# Patient Record
Sex: Male | Born: 1990 | Race: Black or African American | Hispanic: No | Marital: Single | Smoking: Never smoker
Health system: Southern US, Community
[De-identification: ages and names within clinical notes are randomized; demographics above are authoritative.]

## PROBLEM LIST (undated history)

## (undated) DIAGNOSIS — F259 Schizoaffective disorder, unspecified: Secondary | ICD-10-CM

## (undated) DIAGNOSIS — F419 Anxiety disorder, unspecified: Secondary | ICD-10-CM

## (undated) DIAGNOSIS — F319 Bipolar disorder, unspecified: Secondary | ICD-10-CM

## (undated) DIAGNOSIS — J45909 Unspecified asthma, uncomplicated: Secondary | ICD-10-CM

## (undated) HISTORY — PX: FACIAL RECONSTRUCTION SURGERY: SHX631

---

## 2014-11-18 ENCOUNTER — Encounter (HOSPITAL_COMMUNITY): Payer: Self-pay | Admitting: Emergency Medicine

## 2014-11-18 ENCOUNTER — Emergency Department (HOSPITAL_COMMUNITY)
Admission: EM | Admit: 2014-11-18 | Discharge: 2014-11-18 | Disposition: A | Discharge status: Home / Self Care | Payer: Self-pay | Attending: Emergency Medicine | Admitting: Emergency Medicine

## 2014-11-18 ENCOUNTER — Emergency Department (HOSPITAL_COMMUNITY): Payer: Self-pay

## 2014-11-18 DIAGNOSIS — R079 Chest pain, unspecified: Secondary | ICD-10-CM | POA: Insufficient documentation

## 2014-11-18 DIAGNOSIS — J45901 Unspecified asthma with (acute) exacerbation: Secondary | ICD-10-CM | POA: Insufficient documentation

## 2014-11-18 HISTORY — DX: Unspecified asthma, uncomplicated: J45.909

## 2014-11-18 LAB — I-STAT TROPONIN, ED
Troponin i, poc: 0 ng/mL (ref 0.00–0.08)
Troponin i, poc: 0 ng/mL (ref 0.00–0.08)

## 2014-11-18 LAB — BASIC METABOLIC PANEL
ANION GAP: 7 (ref 5–15)
BUN: 17 mg/dL (ref 6–20)
CALCIUM: 9.5 mg/dL (ref 8.9–10.3)
CO2: 25 mmol/L (ref 22–32)
Chloride: 104 mmol/L (ref 101–111)
Creatinine, Ser: 1.11 mg/dL (ref 0.61–1.24)
GFR calc non Af Amer: >60 mL/min (ref >60)
GLUCOSE: 101 mg/dL — AB (ref 70–99)
Potassium: 3.6 mmol/L (ref 3.5–5.1)
SODIUM: 136 mmol/L (ref 135–145)

## 2014-11-18 LAB — CBC
HCT: 45.6 % (ref 39.0–52.0)
HEMOGLOBIN: 16.6 g/dL (ref 13.0–17.0)
MCH: 27 pg (ref 26.0–34.0)
MCHC: 36.4 g/dL — ABNORMAL HIGH (ref 30.0–36.0)
MCV: 74.1 fL — AB (ref 78.0–100.0)
Platelets: 184 10*3/uL (ref 150–400)
RBC: 6.15 MIL/uL — ABNORMAL HIGH (ref 4.22–5.81)
RDW: 13.7 % (ref 11.5–15.5)
WBC: 5.7 10*3/uL (ref 4.0–10.5)

## 2014-11-18 LAB — I-STAT CHEM 8, ED
BUN: 17 mg/dL (ref 6–20)
CHLORIDE: 101 mmol/L (ref 101–111)
Calcium, Ion: 1.17 mmol/L (ref 1.12–1.23)
Creatinine, Ser: 1.1 mg/dL (ref 0.61–1.24)
Glucose, Bld: 91 mg/dL (ref 70–99)
HEMATOCRIT: 47 % (ref 39.0–52.0)
Hemoglobin: 16 g/dL (ref 13.0–17.0)
Potassium: 3.7 mmol/L (ref 3.5–5.1)
Sodium: 141 mmol/L (ref 135–145)
TCO2: 23 mmol/L (ref 0–100)

## 2014-11-18 LAB — BRAIN NATRIURETIC PEPTIDE: B Natriuretic Peptide: 7.4 pg/mL (ref 0.0–100.0)

## 2014-11-18 MED ORDER — ASPIRIN 325 MG PO TABS
325.0000 mg | ORAL_TABLET | ORAL | Status: AC
  Administered 2014-11-18: 325 mg via ORAL
  Filled 2014-11-18: qty 1

## 2014-11-18 NOTE — Discharge Instructions (Signed)
Chest Pain (Nonspecific) °It is often hard to give a diagnosis for the cause of chest pain. There is always a chance that your pain could be related to something serious, such as a heart attack or a blood clot in the lungs. You need to follow up with your doctor. °HOME CARE °· If antibiotic medicine was given, take it as directed by your doctor. Finish the medicine even if you start to feel better. °· For the next few days, avoid activities that bring on chest pain. Continue physical activities as told by your doctor. °· Do not use any tobacco products. This includes cigarettes, chewing tobacco, and e-cigarettes. °· Avoid drinking alcohol. °· Only take medicine as told by your doctor. °· Follow your doctor's suggestions for more testing if your chest pain does not go away. °· Keep all doctor visits you made. °GET HELP IF: °· Your chest pain does not go away, even after treatment. °· You have a rash with blisters on your chest. °· You have a fever. °GET HELP RIGHT AWAY IF:  °· You have more pain or pain that spreads to your arm, neck, jaw, back, or belly (abdomen). °· You have shortness of breath. °· You cough more than usual or cough up blood. °· You have very bad back or belly pain. °· You feel sick to your stomach (nauseous) or throw up (vomit). °· You have very bad weakness. °· You pass out (faint). °· You have chills. °This is an emergency. Do not wait to see if the problems will go away. Call your local emergency services (911 in [place] Do not drive yourself to the hospital. °MAKE SURE YOU:  °· Understand these instructions. °· Will watch your condition. °· Will get help right away if you are not doing well or get worse. °Document Released: 12/16/2007 Document Revised: 07/04/2013 Document Reviewed: 12/16/2007 °ExitCare® Patient Information ©2015 [employer]. This information is not intended to replace advice given to you by your health care provider. Make sure you discuss any questions you have with your  health care provider. ° ° °Emergency Department Resource Guide °1) Find a Doctor and Pay Out of Pocket °Although you won't have to find out who is covered by your insurance plan, it is a good idea to ask around and get recommendations. You will then need to call the office and see if the doctor you have chosen will accept you as a new patient and what types of options they offer for patients who are self-pay. Some doctors offer discounts or will set up payment plans for their patients who do not have insurance, but you will need to ask so you aren't surprised when you get to your appointment. ° °2) Contact Your Local Health Department °Not all health departments have doctors that can see patients for sick visits, but many do, so it is worth a call to see if yours does. If you don't know where your local health department is, you can check in your phone book. The CDC also has a tool to help you locate your state's health department, and many state websites also have listings of all of their local health departments. ° °3) Find a Walk-in Clinic °If your illness is not likely to be very severe or complicated, you may want to try a walk in clinic. These are popping up all over the country in pharmacies, drugstores, and shopping centers. They're usually staffed by nurse practitioners or physician assistants that have been trained to treat common   illnesses and complaints. They're usually fairly quick and inexpensive. However, if you have serious medical issues or chronic medical problems, these are probably not your best option. ° °No Primary Care Doctor: °- Call [facility] at  832-8000 - they can help you locate a primary care doctor that  accepts your insurance, provides certain services, etc. °- Physician Referral Service- 1-800-533-3463 ° °Chronic Pain Problems: °Organization         Address  Phone   Notes  °[facility]  (336) 297-2271 Patients need to be referred by their primary care doctor.   ° °Medication Assistance: °Organization         Address  Phone   Notes  °[place] [facility] [address] °[place], [place] [address] (336) 641-8030 --Must be a resident of [place] °-- Must have NO insurance coverage whatsoever (no Medicaid/ Medicare, etc.) °-- The pt. MUST have a primary care doctor that directs their care regularly and follows them in the community °  °[facility]  (866) 331-1348   °[employer]  (888) 892-1162   ° °Agencies that provide inexpensive medical care: °Organization         Address  Phone   Notes  °[facility]  (336) 832-8035   °[facility]    (336) 832-7272   °[facility] [address] °[place], [place] [address] (336) 832-4777   °[facility] [place] [address], °[place] (336) 271-4999   °[facility]    (336) 373-0678   °[facility]    (336) 272-1050   °[facility] ° [address], [place] Phone:  (336) 832-4444, Fax:  (336) 832-4440 Hours of Operation:  9 am - 6 pm, M-F.  Also accepts Medicaid/Medicare and self-pay.  °[facility] ° [address], [place] Phone: (336) 832-3150, Fax: (336) 832-3151. Hours of Operation:  8:30 am - 5:30 pm, M-F.  Also accepts Medicaid and self-pay.  °[facility] [place] [address], [place] Phone: (336) 878-6027   °[facility] [address], [place], [place] (336)723-1848, Ext. 123 Mondays & Thursdays: 7-9 AM.  First 15 patients are seen on a first come, first serve basis. °  ° °Medicaid-accepting [place] Providers: ° °Organization         Address  Phone   Notes  °[facility] [address], [place] (336) 641-2100 Also accepts self-pay patients.  °[facility] [address], [place] ° (336) 856-9996   °[facility] [address] [place] [address], [place] (336) 288-8857   °[facility] [address] [place] [address], [place] (336) 299-7000   °Veita Bland [address], [place]  ° (336) 373-1557 Only accepts [place] Access Medicaid patients after they have their name applied to their card.  ° °Self-Pay (no insurance) in [place]: ° °Organization         Address  Phone   Notes  °Sickle Cell Patients, [facility] [address], [place] (336) 832-1970   °[facility] [address], [place] (336) 832-4400   °[facility] ° [address], [place] (336) 992-4800   °[facility]/Dr. Osei-Bonsu ° [address] [place] [address], [place] or [address],   [place] (336) 841-8500 Phone number for both [place] and [place] locations is the same.  °[facility] [address], [place] (336) 299-0000   °[facility] [address] [place] [address], [place] or [address] (336) 852-7530 °(336) 878-2260   °[facility] [address], [place] (336) 350-1642, phone; (336) 294-5005, fax Sees patients 1st and 3rd Saturday of every month.  Must not qualify for public or private insurance (i.e. Medicaid, Medicare, [place] Health Choice, Veterans' Benefits) • Household income should be no more than 200% of the poverty level •The clinic cannot treat you if you are pregnant or think you are pregnant • Sexually transmitted diseases are not treated at the clinic.  ° ° °Dental Care: °Organization         Address  Phone  Notes  °[facility] [address], [place] (336) 641-6152 Accepts children up to age 21 who are enrolled in Medicaid or [place] Health Choice; pregnant women with a Medicaid card; and children who have applied for Medicaid or [place] Health Choice, but were declined, whose parents can pay a reduced fee at time of service.    °[facility] [place]  [address], [place] (336) 641-7733 Accepts children up to age 21 who are enrolled in Medicaid or [place] Health Choice; pregnant women with a Medicaid card; and children who have applied for Medicaid or [place] Health Choice, but were declined, whose parents can pay a reduced fee at time of service.  °[facility] ° [address], [place] (336) 641-4533 Patients are seen by appointment only. Walk-ins are not accepted. [facility] will see patients 18 years of age and older. °Monday - Tuesday (8am-5pm) °Most Wednesdays (8:30-5pm) °$30 per visit, cash only  °[facility] ° [address], [place] (336) 641-4533 Patients are seen by appointment only. Walk-ins are not accepted. [facility] will see patients 18 years of age and older. °One Wednesday Evening (Monthly: Volunteer Based).  $30 per visit, cash only  °[facility]  (919) 537-3737 for adults; Children under age 4, call Graduate Pediatric Dentistry at (919) [phone]. Children aged 4-14, please call (919) [phone] to request a pediatric application. ° Dental services are provided in all areas of dental care including fillings, crowns and bridges, complete and partial dentures, implants, gum treatment, root canals, and extractions. Preventive care is also provided. Treatment is provided to both adults and children. °Patients are selected via a lottery and there is often a waiting list. °  °[facility] [address], °[place] ° (336) 763-8833 [email or web address] °  °[facility] [address], [place], [place] (336)723-1848, Ext. 123 Second and Fourth Thursday of each month, opens at 6:30 AM; Clinic ends at 9 AM.  Patients are seen on a first-come first-served basis, and a limited number are seen during each clinic.  ° °[facility] ° [address], [place], [place] (336)  723-7904   Eligibility Requirements °You must have lived in [place], [place], or [place] counties for at least the last three months. °  You cannot be eligible for state or federal sponsored healthcare insurance, including Veterans Administration, Medicaid, or Medicare. °  You generally cannot be eligible for healthcare insurance through your employer.  °  How to apply: °Eligibility screenings are held every Tuesday   and Wednesday afternoon from 1:00 pm until 4:00 pm. You do not need an appointment for the interview!  °[facility] [address], [place], [place] 336-631-2330   °[facility]  336-342-8273   °[facility]  336-703-3100   °[facility]  336-570-6415   ° °Behavioral Health Resources in the Community: °Intensive Outpatient Programs °Organization         Address  Phone  Notes  °[facility] [address], [place], [place] 336-878-6098   °[facility] [address], [place], [place] 336-832-9800   °[facility] [address], [place], [place] ° 336-882-2125   °[facility] [address],  °[place], [place] 1-800-853-5163 or 336-641-4981   °Substance Abuse Resources °Organization         Address  Phone  Notes  °[facility]  336-882-2125   °[facility]  336-784-9470   °[facility]  336-285-9073   °[facility]  336-845-3988   °Residential & Outpatient Substance Abuse Program  1-800-659-3381   °Psychological Services °Organization         Address  Phone  Notes  °[facility]  336- 832-9600   °[facility]  336- 378-7881   °[facility] [address], [place] 1-800-853-5163 or 336-641-4981   ° °Mobile Crisis Teams °Organization         Address  Phone  Notes  °[facility], Mobile Crisis Care Unit  1-877-626-1772   °Assertive °[facility] ° [address] [place], [place] 336-834-9664   °Sharon DeEsch [address] °[place] 336-554-5454   ° °Self-Help/Support Groups °Organization         Address  Phone             Notes  °[facility] - variety of support groups  336- 373-1402 Call for more information  °Narcotics Anonymous (NA), [facility] [address], °[place]  2 meetings at this location  ° °Residential Treatment Programs °Organization         Address  Phone  Notes  °[facility] [address],    °[place]  1-866-801-8205   °[facility] ° [address], [place], [place] 704-293-8524   °[facility] [address], [place] 336-845-3988 Admissions: 8am-3pm M-F  °[facility] [address]    °[place], [place] 336-841-1104   °[facility] [address], [place], [place] 336-379-7146   °[facility] [address]  °[place], [place] 336-285-9073   °[facility] Intensive Outpatient [address], [place], [place] 336-852-3033   °[facility] [address]  °[place], [place] 1-877-615-2722 or 336-784-9470   °[facility] [address] [place], [place] 336-227-7417 Accepts Medicaid  °[facility] [address]  ° [place] 1-800-659-3381 Substance Abuse/Addiction Treatment  ° °[place] Behavioral Health Resources °Organization         Address  Phone  Notes  °[facility]  (888) 581-9988   °Julie Brannon, PhD [address] [place], [place]   (336) 349-5553 or (336) 951-0000   °[facility]   [address] °[place], [place] (336) 349-4454   °[facility] [address], [place], [place] (336) 342-8316 Insurance/Medicaid/sponsorship through [facility]  °[facility] [address]                                    [place], [place] (336) 342-8316  Therapy/tele-psych/case  °[facility] [address]  ° [place], [place] (336) 349-2233    °Dr. Arfeen  (336) 349-4544   °[facility] [place]  [employer] [place] [facility] 1) [address], [place] °2) [address], [place] °3)  [address], [place] (336) 349-3220 °(336) 342-7768 ° °(336) 342-8140   °[place] [facility] (336) 342-1394 or (336) 342-3537 (After Hours)    ° ° ° °

## 2014-11-18 NOTE — ED Notes (Signed)
Pt states that he has been having lt sided throbbing pain in chest.  States that he is shob.  Pt not wanting to talk but will when pressed to.  Family states that a family member is a CMA and felt his pulse and felt periods of tachycardia, bradycardia, and pauses.  NSR on monitor.
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
## 2015-10-11 IMAGING — CR DG CHEST 1V PORT
1 series · 1 of 1 positions shown · non-contrast
Comparison: 06/18/2014

CLINICAL DATA: Throbbing left-sided chest pain today. Dyspnea.
History of asthma.

EXAM:
PORTABLE CHEST - 1 VIEW

[AP]
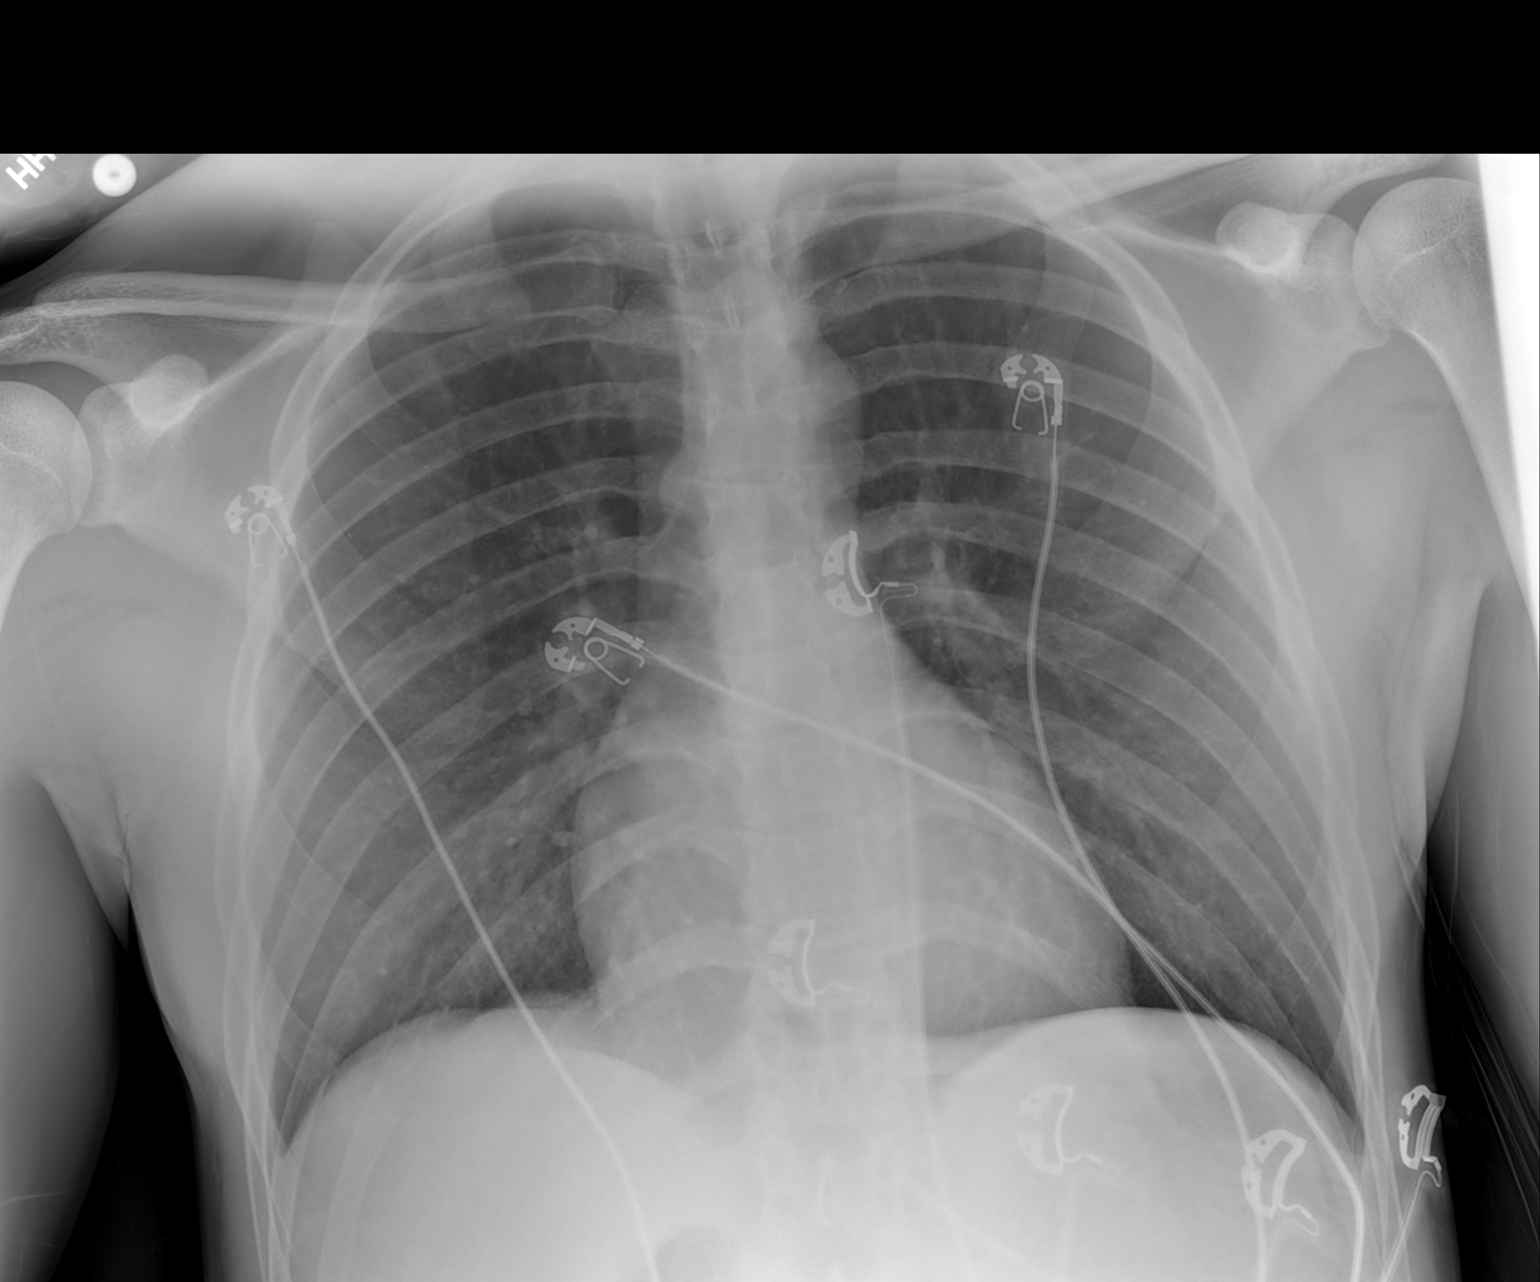

[1 of 1 positions shown; findings below may reference images not displayed]

FINDINGS: A single AP portable view of the chest demonstrates no focal
airspace consolidation or alveolar edema. The lungs are grossly
clear. There is no large effusion or pneumothorax. Cardiac and
mediastinal contours appear unremarkable.
IMPRESSION: No active disease.

## 2016-10-10 ENCOUNTER — Encounter (HOSPITAL_BASED_OUTPATIENT_CLINIC_OR_DEPARTMENT_OTHER): Payer: Self-pay | Admitting: Emergency Medicine

## 2016-10-10 ENCOUNTER — Emergency Department (HOSPITAL_BASED_OUTPATIENT_CLINIC_OR_DEPARTMENT_OTHER)
Admission: EM | Admit: 2016-10-10 | Discharge: 2016-10-10 | Discharge status: Against Medical Advice | Payer: Self-pay | Attending: Dermatology | Admitting: Dermatology

## 2016-10-10 DIAGNOSIS — F419 Anxiety disorder, unspecified: Secondary | ICD-10-CM | POA: Insufficient documentation

## 2016-10-10 DIAGNOSIS — Z5321 Procedure and treatment not carried out due to patient leaving prior to being seen by health care provider: Secondary | ICD-10-CM | POA: Insufficient documentation

## 2016-10-10 HISTORY — DX: Schizoaffective disorder, unspecified: F25.9

## 2016-10-10 HISTORY — DX: Anxiety disorder, unspecified: F41.9

## 2016-10-10 HISTORY — DX: Bipolar disorder, unspecified: F31.9

## 2016-10-10 NOTE — ED Triage Notes (Signed)
Woke up SOB today and went to work and felt like it got worse. Brought in by EMS, denies SOB or anxiety at present. Drowsy in triage. C/O of being"tired"

## 2021-12-13 ENCOUNTER — Emergency Department (HOSPITAL_BASED_OUTPATIENT_CLINIC_OR_DEPARTMENT_OTHER)
Admission: EM | Admit: 2021-12-13 | Discharge: 2021-12-13 | Disposition: A | Discharge status: Home / Self Care | Payer: Self-pay | Attending: Emergency Medicine | Admitting: Emergency Medicine

## 2021-12-13 ENCOUNTER — Other Ambulatory Visit: Payer: Self-pay

## 2021-12-13 ENCOUNTER — Encounter (HOSPITAL_BASED_OUTPATIENT_CLINIC_OR_DEPARTMENT_OTHER): Payer: Self-pay | Admitting: Emergency Medicine

## 2021-12-13 DIAGNOSIS — G43009 Migraine without aura, not intractable, without status migrainosus: Secondary | ICD-10-CM | POA: Insufficient documentation

## 2021-12-13 MED ORDER — DIPHENHYDRAMINE HCL 50 MG/ML IJ SOLN
12.5000 mg | Freq: Once | INTRAMUSCULAR | Status: AC
  Administered 2021-12-13: 12.5 mg via INTRAVENOUS
  Filled 2021-12-13: qty 1

## 2021-12-13 MED ORDER — SODIUM CHLORIDE 0.9 % IV BOLUS
1000.0000 mL | Freq: Once | INTRAVENOUS | Status: AC
  Administered 2021-12-13: 1000 mL via INTRAVENOUS

## 2021-12-13 MED ORDER — METOCLOPRAMIDE HCL 5 MG/ML IJ SOLN
10.0000 mg | Freq: Once | INTRAMUSCULAR | Status: AC
  Administered 2021-12-13: 10 mg via INTRAVENOUS
  Filled 2021-12-13: qty 2

## 2021-12-13 MED ORDER — KETOROLAC TROMETHAMINE 15 MG/ML IJ SOLN
15.0000 mg | Freq: Once | INTRAMUSCULAR | Status: AC
  Administered 2021-12-13: 15 mg via INTRAVENOUS
  Filled 2021-12-13: qty 1

## 2021-12-13 NOTE — ED Triage Notes (Signed)
Patient states that he has had three migraines within the last month. States that  he is nauseous.States that he does not have any medication for his migrainges

## 2021-12-13 NOTE — ED Provider Notes (Addendum)
[facility] EMERGENCY DEPARTMENT Provider Note   CSN: [id number] Arrival date & time: 12/13/21  7829     History  Chief Complaint  Patient presents with   Migraine    Jason Wang is a 31 y.o. male with a past medical history of migraines presenting today with a migraine.  Says he woke up this morning and had a migraine, nausea and vomiting.  Denies photophobia but says "I just cannot keep my eyes open."  No phonophobia.  He does have a history of migraines, followed with neurology for a year, was prescribed sumatriptan but eventually they told him he no longer needed to take it.  He has had 3 migraines this month.  Does not have any more medications.   Migraine Associated symptoms include headaches.      Home Medications Prior to Admission medications   Medication Sig Start Date End Date Taking? Authorizing Provider  QUEtiapine (SEROQUEL) 100 MG tablet Take 100 mg by mouth at bedtime.    [provider]      Allergies    Fish allergy    Review of Systems   Review of Systems  Gastrointestinal:  Positive for nausea and vomiting.  Neurological:  Positive for dizziness and headaches. Negative for speech difficulty, weakness and numbness.   Physical Exam Updated Vital Signs BP 117/79   Pulse 68   Temp 97.7 F (36.5 C) (Oral)   Resp 18   SpO2 100%  Physical Exam Vitals and nursing note reviewed.  Constitutional:      Appearance: Normal appearance. He is ill-appearing.  HENT:     Head: Normocephalic and atraumatic.  Eyes:     General: No scleral icterus.    Extraocular Movements: Extraocular movements intact.     Conjunctiva/sclera: Conjunctivae normal.     Pupils: Pupils are equal, round, and reactive to light.  Cardiovascular:     Rate and Rhythm: Normal rate and regular rhythm.  Pulmonary:     Effort: Pulmonary effort is normal. No respiratory distress.     Breath sounds: No wheezing.  Skin:    General: Skin is warm and dry.      Findings: No rash.  Neurological:     Mental Status: He is alert and oriented to person, place, and time.     Cranial Nerves: No cranial nerve deficit.     Comments: Difficulty participating in finger-nose due to pain with eyes open  Psychiatric:        Mood and Affect: Mood normal.    ED Results / Procedures / Treatments   Labs (all labs ordered are listed, but only abnormal results are displayed) Labs Reviewed - No data to display  EKG None  Radiology No results found.  Procedures Procedures   Medications Ordered in ED Medications  ketorolac (TORADOL) 15 MG/ML injection 15 mg (has no administration in time range)  metoCLOPramide (REGLAN) injection 10 mg (has no administration in time range)  diphenhydrAMINE (BENADRYL) injection 12.5 mg (has no administration in time range)  sodium chloride 0.9 % bolus 1,000 mL (1,000 mLs Intravenous New Bag/Given 12/13/21 1001)    ED Course/ Medical Decision Making/ A&P Clinical Course as of 12/13/21 1132  Sat Dec 13, 2021  1028 Reports feeling somewhat better [MR]    Clinical Course User Index [MR] Neill Jurewicz, Gabriel Cirri, PA-C                           Medical Decision  Making Risk Prescription drug management.   This patient presents to the ED for concern of migraine. Emergent considerations for headache include subarachnoid hemorrhage, meningitis, temporal arteritis, glaucoma, cerebral ischemia, carotid/vertebral dissection, intracranial tumor, Venous sinus thrombosis, carbon monoxide poisoning, acute or chronic subdural hemorrhage.     This is not an exhaustive differential.    Past Medical History / Co-morbidities / Social History: Chronic migraines   Physical Exam: Physical exam performed. The pertinent findings include: None  Lab Tests: None ordered   Imaging Studies: Imaging considered however nonfocal or concerning neurologic exam.  Doubt acute intracranial etiology of headache.    Medications: I ordered medication  including Toradol, Reglan, benadryl and IVF. Reevaluation of the patient after these medicines showed that the patient resolved. I have reviewed the patients home medicines and have made adjustments as needed.   Disposition: After consideration of the diagnostic results and the patients response to treatment, I feel that patient is stable for discharge home.  He is agreeable and requesting discharge at this time.  He will continue over-the-counter migraine care and follow-up with neurology.   Final Clinical Impression(s) / ED Diagnoses Final diagnoses:  Migraine without aura and without status migrainosus, not intractable    Rx / DC Orders   Results and diagnoses were explained to the patient. Return precautions discussed in full. Patient had no additional questions and expressed complete understanding.   This chart was dictated using voice recognition software.  Despite best efforts to proofread,  errors can occur which can change the documentation meaning.    Jason Benders, PA-C 12/13/21 1135   Patient ambulated out of the department but appeared somewhat off balance.  RN came and notified me.  I went to the bedside to reassess the patient and he was still neurologically intact.  No problem with finger-nose, PERRLA, moving all extremities.  Says that he feels somewhat drowsy.  Likely secondary to IV Benadryl.  His mother is on the way to pick him up.  He is still stable for discharge with return precautions   Jason Benders, PA-C 12/13/21 1202    Rolan Bucco, MD 12/13/21 1429

## 2021-12-13 NOTE — ED Notes (Signed)
Ambulatory to [facility] to wait for ride. Drinking po fluids and eating crackers

## 2021-12-13 NOTE — Discharge Instructions (Signed)
Please follow-up with your neurologist about the frequency of your migraines.  If you no longer have 1, there is an office attached to these papers.  You may need prescription medication as you used in the past.

## 2022-09-13 ENCOUNTER — Emergency Department (HOSPITAL_BASED_OUTPATIENT_CLINIC_OR_DEPARTMENT_OTHER)
Admission: EM | Admit: 2022-09-13 | Discharge: 2022-09-13 | Disposition: A | Discharge status: Home / Self Care | Payer: BC Managed Care – PPO | Attending: Emergency Medicine | Admitting: Emergency Medicine

## 2022-09-13 ENCOUNTER — Encounter (HOSPITAL_BASED_OUTPATIENT_CLINIC_OR_DEPARTMENT_OTHER): Payer: Self-pay

## 2022-09-13 ENCOUNTER — Other Ambulatory Visit: Payer: Self-pay

## 2022-09-13 ENCOUNTER — Emergency Department (HOSPITAL_BASED_OUTPATIENT_CLINIC_OR_DEPARTMENT_OTHER): Payer: BC Managed Care – PPO

## 2022-09-13 DIAGNOSIS — R1011 Right upper quadrant pain: Secondary | ICD-10-CM | POA: Diagnosis not present

## 2022-09-13 DIAGNOSIS — R1013 Epigastric pain: Secondary | ICD-10-CM | POA: Insufficient documentation

## 2022-09-13 LAB — CBC WITH DIFFERENTIAL/PLATELET
Abs Immature Granulocytes: 0.01 10*3/uL (ref 0.00–0.07)
Basophils Absolute: 0 10*3/uL (ref 0.0–0.1)
Basophils Relative: 1 %
Eosinophils Absolute: 0.1 10*3/uL (ref 0.0–0.5)
Eosinophils Relative: 3 %
HCT: 40.3 % (ref 39.0–52.0)
Hemoglobin: 14.3 g/dL (ref 13.0–17.0)
Immature Granulocytes: 0 %
Lymphocytes Relative: 60 %
Lymphs Abs: 3 10*3/uL (ref 0.7–4.0)
MCH: 26.3 pg (ref 26.0–34.0)
MCHC: 35.5 g/dL (ref 30.0–36.0)
MCV: 74.2 fL — ABNORMAL LOW (ref 80.0–100.0)
Monocytes Absolute: 0.4 10*3/uL (ref 0.1–1.0)
Monocytes Relative: 7 %
Neutro Abs: 1.5 10*3/uL — ABNORMAL LOW (ref 1.7–7.7)
Neutrophils Relative %: 29 %
Platelets: 205 10*3/uL (ref 150–400)
RBC: 5.43 MIL/uL (ref 4.22–5.81)
RDW: 13.8 % (ref 11.5–15.5)
WBC: 5 10*3/uL (ref 4.0–10.5)
nRBC: 0 % (ref 0.0–0.2)

## 2022-09-13 LAB — COMPREHENSIVE METABOLIC PANEL
ALT: 14 U/L (ref 0–44)
AST: 17 U/L (ref 15–41)
Albumin: 4.1 g/dL (ref 3.5–5.0)
Alkaline Phosphatase: 42 U/L (ref 38–126)
Anion gap: 6 (ref 5–15)
BUN: 13 mg/dL (ref 6–20)
CO2: 24 mmol/L (ref 22–32)
Calcium: 8.4 mg/dL — ABNORMAL LOW (ref 8.9–10.3)
Chloride: 105 mmol/L (ref 98–111)
Creatinine, Ser: 1.12 mg/dL (ref 0.61–1.24)
GFR, Estimated: >60 mL/min (ref >60)
Glucose, Bld: 96 mg/dL (ref 70–99)
Potassium: 3.5 mmol/L (ref 3.5–5.1)
Sodium: 135 mmol/L (ref 135–145)
Total Bilirubin: 1.3 mg/dL — ABNORMAL HIGH (ref 0.3–1.2)
Total Protein: 7.3 g/dL (ref 6.5–8.1)

## 2022-09-13 LAB — LIPASE, BLOOD: Lipase: 37 U/L (ref 11–51)

## 2022-09-13 MED ORDER — SODIUM CHLORIDE 0.9 % IV BOLUS
1000.0000 mL | Freq: Once | INTRAVENOUS | Status: AC
  Administered 2022-09-13: 1000 mL via INTRAVENOUS

## 2022-09-13 MED ORDER — SUCRALFATE 1 G PO TABS: 1.0000 g | ORAL_TABLET | Freq: Three times a day (TID) | ORAL | 0 refills | Status: DC

## 2022-09-13 MED ORDER — PANTOPRAZOLE SODIUM 40 MG PO TBEC
40.0000 mg | DELAYED_RELEASE_TABLET | Freq: Every day | ORAL | Status: DC
  Administered 2022-09-13: 40 mg via ORAL
  Filled 2022-09-13: qty 1

## 2022-09-13 MED ORDER — PANTOPRAZOLE SODIUM 20 MG PO TBEC: 20.0000 mg | DELAYED_RELEASE_TABLET | Freq: Every day | ORAL | 0 refills | Status: DC

## 2022-09-13 NOTE — ED Provider Notes (Signed)
[place] EMERGENCY DEPARTMENT AT [facility] Provider Note   CSN: [id number] Arrival date & time: 09/13/22  2148     History  Chief Complaint  Patient presents with   Abdominal Pain    Jason Wang is a 32 y.o. male.  Patient here with abdominal pain for the last few days.  On the right upper quadrant, epigastric, right flank.  No history of kidney stones.  Denies any nausea vomiting or diarrhea.  No prior abdominal surgery.  He is not sure what makes it worse or better.  Denies any chest pain or shortness of breath.  No fever or chills.  States has history of ulcers.  Denies any black or bloody stools.  The history is provided by the patient.       Home Medications Prior to Admission medications   Medication Sig Start Date End Date Taking? Authorizing Provider  pantoprazole (PROTONIX) 20 MG tablet Take 1 tablet (20 mg total) by mouth daily for 14 days. 09/13/22 09/27/22 Yes Maely Clements, DO  sucralfate (CARAFATE) 1 g tablet Take 1 tablet (1 g total) by mouth 4 (four) times daily -  with meals and at bedtime for 14 days. 09/13/22 09/27/22 Yes Nelma Phagan, DO  QUEtiapine (SEROQUEL) 100 MG tablet Take 100 mg by mouth at bedtime.    [provider]      Allergies    Fish allergy    Review of Systems   Review of Systems  Physical Exam Updated Vital Signs BP (!) 126/94   Pulse 65   Temp 97.7 F (36.5 C) (Oral)   Resp 15   Ht '5\' 6"'$  (1.676 m)   Wt 68.9 kg   SpO2 99%   BMI 24.53 kg/m  Physical Exam Vitals and nursing note reviewed.  Constitutional:      General: He is not in acute distress.    Appearance: He is well-developed. He is not ill-appearing.  HENT:     Head: Normocephalic and atraumatic.     Mouth/Throat:     Mouth: Mucous membranes are moist.  Eyes:     Conjunctiva/sclera: Conjunctivae normal.  Cardiovascular:     Rate and Rhythm: Normal rate and regular rhythm.     Heart sounds: Normal heart sounds. No murmur heard. Pulmonary:      Effort: Pulmonary effort is normal. No respiratory distress.     Breath sounds: Normal breath sounds.  Abdominal:     Palpations: Abdomen is soft.     Tenderness: There is abdominal tenderness in the right upper quadrant and epigastric area. There is right CVA tenderness.  Musculoskeletal:        General: No swelling.     Cervical back: Neck supple.  Skin:    General: Skin is warm and dry.     Capillary Refill: Capillary refill takes less than 2 seconds.  Neurological:     Mental Status: He is alert.  Psychiatric:        Mood and Affect: Mood normal.     ED Results / Procedures / Treatments   Labs (all labs ordered are listed, but only abnormal results are displayed) Labs Reviewed  CBC WITH DIFFERENTIAL/PLATELET - Abnormal; Notable for the following components:      Result Value   MCV 74.2 (*)    Neutro Abs 1.5 (*)    All other components within normal limits  COMPREHENSIVE METABOLIC PANEL - Abnormal; Notable for the following components:   Calcium 8.4 (*)  Total Bilirubin 1.3 (*)    All other components within normal limits  LIPASE, BLOOD    EKG None  Radiology CT Renal Stone Study  Result Date: 09/13/2022 CLINICAL DATA:  Right-sided flank pain for several days, initial encounter EXAM: CT ABDOMEN AND PELVIS WITHOUT CONTRAST TECHNIQUE: Multidetector CT imaging of the abdomen and pelvis was performed following the standard protocol without IV contrast. RADIATION DOSE REDUCTION: This exam was performed according to the departmental dose-optimization program which includes automated exposure control, adjustment of the mA and/or kV according to patient size and/or use of iterative reconstruction technique. COMPARISON:  None Available. FINDINGS: Lower chest: No acute abnormality. Hepatobiliary: No focal liver abnormality is seen. No gallstones, gallbladder wall thickening, or biliary dilatation. Pancreas: Unremarkable. No pancreatic ductal dilatation or surrounding  inflammatory changes. Spleen: Normal in size without focal abnormality. Adrenals/Urinary Tract: Adrenal glands are within normal limits. Kidneys are well visualized bilaterally. No renal calculi or obstructive changes are noted. The ureters are within normal limits. Bladder is partially distended. Stomach/Bowel: No obstructive or inflammatory changes of the colon are seen. Mild retained fecal material is seen. The appendix is well visualized and within normal limits. Small bowel and stomach are unremarkable. Vascular/Lymphatic: No significant vascular findings are present. No enlarged abdominal or pelvic lymph nodes. Reproductive: Prostate is unremarkable. Other: No abdominal wall hernia or abnormality. No abdominopelvic ascites. Musculoskeletal: No acute or significant osseous findings. IMPRESSION: No acute abnormality noted. Electronically Signed   By: Inez Catalina M.D.   On: 09/13/2022 22:16    Procedures Procedures    Medications Ordered in ED Medications  sodium chloride 0.9 % bolus 1,000 mL (1,000 mLs Intravenous New Bag/Given 09/13/22 2209)    ED Course/ Medical Decision Making/ A&P                             Medical Decision Making Amount and/or Complexity of Data Reviewed Labs: ordered. Radiology: ordered.   Jen Rivka Barbara is here with abdominal pain.  Normal vitals.  No fever.  History of anxiety, bipolar, schizoaffective disorder.  Differential diagnosis is gastritis versus possibly cholecystitis, gallstones, kidney stone, less likely colitis/appendicitis, bowel obstruction.  Will get CBC, CMP, lipase, CT scan abdomen pelvis.  Per my review and interpretation labs no significant anemia or electrolyte abnormality or kidney injury or leukocytosis.  CT scan per radiology reports unremarkable.  Will put him on Protonix and Carafate for suspected reflux/gastritis.  Will have him follow-up with primary care doctor.  Discharged in good condition.  This chart was dictated using voice  recognition software.  Despite best efforts to proofread,  errors can occur which can change the documentation meaning.         Final Clinical Impression(s) / ED Diagnoses Final diagnoses:  Epigastric pain    Rx / DC Orders ED Discharge Orders          Ordered    pantoprazole (PROTONIX) 20 MG tablet  Daily        09/13/22 2230    sucralfate (CARAFATE) 1 g tablet  3 times daily with meals & bedtime        09/13/22 2230              Lennice Sites, DO 09/13/22 2230

## 2022-09-13 NOTE — ED Triage Notes (Signed)
Pt reports right upper/lower abdominal pain that shift higher and lower intermittently. Pain has been constant over last 4 days. Pt reports nausea, denies vomiting. Denies weakness/fatigue/fevers. No aggravating/alleviating factors noted besides being less painful in the morning.

## 2022-09-16 ENCOUNTER — Encounter: Payer: Self-pay | Admitting: *Deleted

## 2022-09-16 ENCOUNTER — Ambulatory Visit
Admission: EM | Admit: 2022-09-16 | Discharge: 2022-09-16 | Disposition: A | Discharge status: Home / Self Care | Payer: BC Managed Care – PPO

## 2022-09-16 DIAGNOSIS — K5900 Constipation, unspecified: Secondary | ICD-10-CM

## 2022-09-16 NOTE — Discharge Instructions (Signed)
While there are no acute findings on the CT scanning of your abdomen that was done at the emergency room, I do believe that you have a moderate to high stool burden at this time which is the most likely cause of your current discomfort.  I recommend that you go to [facility] across the street and purchase a bottle of magnesium citrate saline laxative and any flavor that you like.  Please drink the entire bottle and anticipate a large bowel movement in the next hour or so.  For the next 24 hours, recommend that you follow a clear liquid diet meaning you can eat or drink anything that you can see through such as broth, Gatorade, ginger ale, fruit flavored Jell-O's.  After 24 hours, if your belly is feeling better, you can reintroduce bland foods avoiding things that are spicy, greasy, acidic or contain dairy.  Please be sure that you drink between 80 and 120 ounces of water per day on a regular basis and be sure that you are eating enough fresh fruits and vegetables and high-fiber grains.
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
## 2022-09-16 NOTE — ED Triage Notes (Signed)
C/O starting with right abd pain 6 days ago; states had appt with PCP & had labs drawn. 3 days ago the pain was getting worse, so pt went to ED and had CT scan - states was told everything was fine and to start med to help with pain. Today as he was leaving work to get Rx, pt felt a "bubbling" sensation followed by burning in RLQ. Pain much worse with movement. States has not been able to have BM despite sensation that he needs to. Last BM was this AM "very small".

## 2022-09-19 ENCOUNTER — Emergency Department (HOSPITAL_BASED_OUTPATIENT_CLINIC_OR_DEPARTMENT_OTHER)
Admission: EM | Admit: 2022-09-19 | Discharge: 2022-09-19 | Disposition: A | Discharge status: Home / Self Care | Payer: BC Managed Care – PPO | Attending: Emergency Medicine | Admitting: Emergency Medicine

## 2022-09-19 ENCOUNTER — Other Ambulatory Visit: Payer: Self-pay

## 2022-09-19 ENCOUNTER — Encounter (HOSPITAL_BASED_OUTPATIENT_CLINIC_OR_DEPARTMENT_OTHER): Payer: Self-pay | Admitting: Emergency Medicine

## 2022-09-19 DIAGNOSIS — R109 Unspecified abdominal pain: Secondary | ICD-10-CM | POA: Diagnosis present

## 2022-09-19 DIAGNOSIS — R1030 Lower abdominal pain, unspecified: Secondary | ICD-10-CM

## 2022-09-19 DIAGNOSIS — R1013 Epigastric pain: Secondary | ICD-10-CM | POA: Insufficient documentation

## 2022-09-19 DIAGNOSIS — R1031 Right lower quadrant pain: Secondary | ICD-10-CM | POA: Diagnosis not present

## 2022-09-19 LAB — CBC
HCT: 42.5 % (ref 39.0–52.0)
Hemoglobin: 14.9 g/dL (ref 13.0–17.0)
MCH: 26.3 pg (ref 26.0–34.0)
MCHC: 35.1 g/dL (ref 30.0–36.0)
MCV: 75 fL — ABNORMAL LOW (ref 80.0–100.0)
Platelets: 194 10*3/uL (ref 150–400)
RBC: 5.67 MIL/uL (ref 4.22–5.81)
RDW: 13.9 % (ref 11.5–15.5)
WBC: 4.2 10*3/uL (ref 4.0–10.5)
nRBC: 0 % (ref 0.0–0.2)

## 2022-09-19 LAB — COMPREHENSIVE METABOLIC PANEL
ALT: 15 U/L (ref 0–44)
AST: 19 U/L (ref 15–41)
Albumin: 4.3 g/dL (ref 3.5–5.0)
Alkaline Phosphatase: 46 U/L (ref 38–126)
Anion gap: 5 (ref 5–15)
BUN: 6 mg/dL (ref 6–20)
CO2: 27 mmol/L (ref 22–32)
Calcium: 8.6 mg/dL — ABNORMAL LOW (ref 8.9–10.3)
Chloride: 103 mmol/L (ref 98–111)
Creatinine, Ser: 1.2 mg/dL (ref 0.61–1.24)
GFR, Estimated: >60 mL/min (ref >60)
Glucose, Bld: 114 mg/dL — ABNORMAL HIGH (ref 70–99)
Potassium: 3.5 mmol/L (ref 3.5–5.1)
Sodium: 135 mmol/L (ref 135–145)
Total Bilirubin: 1.3 mg/dL — ABNORMAL HIGH (ref 0.3–1.2)
Total Protein: 8 g/dL (ref 6.5–8.1)

## 2022-09-19 LAB — URINALYSIS, ROUTINE W REFLEX MICROSCOPIC
Bilirubin Urine: NEGATIVE
Glucose, UA: NEGATIVE mg/dL
Hgb urine dipstick: NEGATIVE
Ketones, ur: NEGATIVE mg/dL
Leukocytes,Ua: NEGATIVE
Nitrite: NEGATIVE
Protein, ur: NEGATIVE mg/dL
Specific Gravity, Urine: 1.01 (ref 1.005–1.030)
pH: 6 (ref 5.0–8.0)

## 2022-09-19 LAB — LIPASE, BLOOD: Lipase: 35 U/L (ref 11–51)

## 2022-09-19 MED ORDER — KETOROLAC TROMETHAMINE 15 MG/ML IJ SOLN
15.0000 mg | Freq: Once | INTRAMUSCULAR | Status: AC
Start: 2022-09-19 — End: 2022-09-19
  Administered 2022-09-19: 15 mg via INTRAVENOUS
  Filled 2022-09-19: qty 1

## 2022-09-19 MED ORDER — DICYCLOMINE HCL 20 MG PO TABS: 20.0000 mg | ORAL_TABLET | Freq: Two times a day (BID) | ORAL | 0 refills | Status: DC

## 2022-09-19 NOTE — ED Notes (Signed)
Discharge paperwork reviewed entirely with patient, including Rx's and follow up care. Pain was under control. Pt verbalized understanding as well as all parties involved. No questions or concerns voiced at the time of discharge. No acute distress noted.   Pt ambulated out to PVA without incident or assistance.  

## 2022-09-19 NOTE — Discharge Instructions (Addendum)
You were evaluated today for abdominal pain.  Your blood work is all very reassuring.  You were given a prescription for some medication called Bentyl to see if that can help with your pain.  Please follow-up with the GI doctor.  If your pain worsens or he develop fever, vomiting or any other worrisome changes you need to come back to the ER right away as you may need repeat CT scan at that time

## 2022-09-19 NOTE — ED Triage Notes (Signed)
Pt here for constant abd pain x1 week. Pt saw his PCP and was prescribed pantoprazole. Pt was then seen for the same 3 days ago at [facility], and was prescribed meds and laxatives. Pt states pain is RLQ cramping and radiates to his back. Pt reports last BM was Thursday. Pt has been following clear liquid diet

## 2022-09-19 NOTE — ED Provider Notes (Signed)
[facility] Provider Note   CSN: [id number] Arrival date & time: 09/19/22  1749     History  Chief Complaint  Patient presents with   Abdominal Pain    Jason Wang is a 32 y.o. male.  He presents emergency department for abdominal pain.  He was seen on 3/9 for pain at that time and had labs and a CT scan all of which were negative.  He states the pain got significantly worse 3 days later and he went to urgent care.  They gave him laxatives which she took with some relief but states his pain still has not completely gone away.  He states the days ago his pain was at its worst but his pain has not gone above a 5 out of 10 since then.  Denies fevers or chills or vomiting.  Denies any exacerbating or relieving factors   Abdominal Pain      Home Medications Prior to Admission medications   Medication Sig Start Date End Date Taking? Authorizing Provider  dicyclomine (BENTYL) 20 MG tablet Take 1 tablet (20 mg total) by mouth 2 (two) times daily. 09/19/22  Yes Oluwatimilehin Balfour A, PA-C  pantoprazole (PROTONIX) 20 MG tablet Take 1 tablet (20 mg total) by mouth daily for 14 days. 09/13/22 09/27/22  Curatolo, Adam, DO  SUMAtriptan Succinate (IMITREX PO) Take by mouth. prn    [provider]      Allergies    Fish allergy and Bee venom    Review of Systems   Review of Systems  Gastrointestinal:  Positive for abdominal pain.    Physical Exam Updated Vital Signs BP 120/79 (BP Location: Right Arm)   Pulse 70   Temp 97.7 F (36.5 C) (Oral)   Resp 16   SpO2 98%  Physical Exam Vitals and nursing note reviewed.  Constitutional:      General: He is not in acute distress.    Appearance: He is well-developed.  HENT:     Head: Normocephalic and atraumatic.  Eyes:     Conjunctiva/sclera: Conjunctivae normal.  Cardiovascular:     Rate and Rhythm: Normal rate and regular rhythm.     Heart sounds: No murmur heard. Pulmonary:      Effort: Pulmonary effort is normal. No respiratory distress.     Breath sounds: Normal breath sounds.  Abdominal:     Palpations: Abdomen is soft.     Tenderness: There is abdominal tenderness in the right lower quadrant, epigastric area and suprapubic area. There is no right CVA tenderness, left CVA tenderness, guarding or rebound. Negative signs include Murphy's sign, Rovsing's sign and McBurney's sign.  Musculoskeletal:        General: No swelling.     Cervical back: Neck supple.  Skin:    General: Skin is warm and dry.     Capillary Refill: Capillary refill takes less than 2 seconds.  Neurological:     General: No focal deficit present.     Mental Status: He is alert.  Psychiatric:        Mood and Affect: Mood normal.     ED Results / Procedures / Treatments   Labs (all labs ordered are listed, but only abnormal results are displayed) Labs Reviewed  COMPREHENSIVE METABOLIC PANEL - Abnormal; Notable for the following components:      Result Value   Glucose, Bld 114 (*)    Calcium 8.6 (*)    Total Bilirubin 1.3 (*)  All other components within normal limits  CBC - Abnormal; Notable for the following components:   MCV 75.0 (*)    All other components within normal limits  LIPASE, BLOOD  URINALYSIS, ROUTINE W REFLEX MICROSCOPIC    EKG None  Radiology No results found.  Procedures Procedures    Medications Ordered in ED Medications  ketorolac (TORADOL) 15 MG/ML injection 15 mg (15 mg Intravenous Given 09/19/22 1843)    ED Course/ Medical Decision Making/ A&P                             Medical Decision Making This patient presents to the ED for concern of right-sided abdominal pain, this involves an extensive number of treatment options, and is a complaint that carries with it a high risk of complications and morbidity.  The differential diagnosis includes gastritis, cholecystitis, cholelithiasis, UTI, colitis, inflammatory bowel disease, appendicitis,  other     Additional history obtained:  Additional history obtained from EMR External records from outside source obtained and reviewed including CT scan, recent ED visit and care visit   Lab Tests:  I Ordered, and personally interpreted labs.  The pertinent results include: CMP shows a bilirubin of 1.3 which is stable from his recent visit, normal renal function, lipase is normal, UA is negative, CBC shows no leukocytosis, no anemia      Problem List / ED Course / Critical interventions / Medication management  Abdominal pain-patient is having right mid and lower abdominal pain and suprapubic tenderness ongoing for about a week, had CT 6 days ago that was normal, pain was at its worst 3 days ago when he went to urgent care and he was given laxatives for possible constipation as accounts and he had large amount of bowel movements but has stopped the pain but states that it has been better since Wednesday.  He not having nausea or vomiting, discussed recent benefits of repeat CT, use shared decision making.  Patient does not feel he needs repeat CT at this time, discussed that he has abnormal labs and vital signs and his abdomen is tender but he has no rebound guarding or rigidity.  He had appendicitis or intra-abdominal abscess or other severe intra-abdominal process was suspected some change in vitals and/or labs.  Discussed he will need to follow-up with GI but if his symptoms worsen he needs to come back to the ER right away I ordered medication including Toradol for pain Reevaluation of the patient after these medicines showed that the patient improved I have reviewed the patients home medicines and have made adjustments as needed    Test / Admission - Considered:   Repeat CT scan.  Patient's labs are completely stable, vitals are reassuring.  He is not tachycardic or febrile denies fevers at home.  His pain is improved after the Toradol.  Discussed risk and benefits of repeat CT  scan at this time.  Patient is agreeable with holding off on repeat imaging as his pain has been slightly better the past 3 days    Amount and/or Complexity of Data Reviewed Labs: ordered.  Risk Prescription drug management.           Final Clinical Impression(s) / ED Diagnoses Final diagnoses:  Lower abdominal pain    Rx / DC Orders ED Discharge Orders          Ordered    dicyclomine (BENTYL) 20 MG tablet  2 times daily  09/19/22 1948              Gwenevere Abbot, PA-C 09/19/22 1956    Cristie Hem, MD 09/20/22 1505

## 2022-10-01 ENCOUNTER — Other Ambulatory Visit: Payer: Self-pay

## 2022-10-01 ENCOUNTER — Emergency Department (HOSPITAL_BASED_OUTPATIENT_CLINIC_OR_DEPARTMENT_OTHER)
Admission: EM | Admit: 2022-10-01 | Discharge: 2022-10-02 | Disposition: A | Discharge status: Home / Self Care | Payer: BC Managed Care – PPO | Attending: Emergency Medicine | Admitting: Emergency Medicine

## 2022-10-01 ENCOUNTER — Emergency Department (HOSPITAL_BASED_OUTPATIENT_CLINIC_OR_DEPARTMENT_OTHER): Payer: BC Managed Care – PPO

## 2022-10-01 DIAGNOSIS — R1084 Generalized abdominal pain: Secondary | ICD-10-CM | POA: Diagnosis not present

## 2022-10-01 DIAGNOSIS — J45909 Unspecified asthma, uncomplicated: Secondary | ICD-10-CM | POA: Insufficient documentation

## 2022-10-01 DIAGNOSIS — R109 Unspecified abdominal pain: Secondary | ICD-10-CM | POA: Diagnosis present

## 2022-10-01 LAB — CBC WITH DIFFERENTIAL/PLATELET
Abs Immature Granulocytes: 0.01 10*3/uL (ref 0.00–0.07)
Basophils Absolute: 0 10*3/uL (ref 0.0–0.1)
Basophils Relative: 1 %
Eosinophils Absolute: 0.1 10*3/uL (ref 0.0–0.5)
Eosinophils Relative: 1 %
HCT: 42.5 % (ref 39.0–52.0)
Hemoglobin: 15.3 g/dL (ref 13.0–17.0)
Immature Granulocytes: 0 %
Lymphocytes Relative: 61 %
Lymphs Abs: 3.4 10*3/uL (ref 0.7–4.0)
MCH: 26.4 pg (ref 26.0–34.0)
MCHC: 36 g/dL (ref 30.0–36.0)
MCV: 73.3 fL — ABNORMAL LOW (ref 80.0–100.0)
Monocytes Absolute: 0.4 10*3/uL (ref 0.1–1.0)
Monocytes Relative: 8 %
Neutro Abs: 1.6 10*3/uL — ABNORMAL LOW (ref 1.7–7.7)
Neutrophils Relative %: 29 %
Platelets: 187 10*3/uL (ref 150–400)
RBC: 5.8 MIL/uL (ref 4.22–5.81)
RDW: 13.7 % (ref 11.5–15.5)
WBC: 5.5 10*3/uL (ref 4.0–10.5)
nRBC: 0 % (ref 0.0–0.2)

## 2022-10-01 LAB — COMPREHENSIVE METABOLIC PANEL
ALT: 14 U/L (ref 0–44)
AST: 25 U/L (ref 15–41)
Albumin: 4.4 g/dL (ref 3.5–5.0)
Alkaline Phosphatase: 51 U/L (ref 38–126)
Anion gap: 13 (ref 5–15)
BUN: 9 mg/dL (ref 6–20)
CO2: 21 mmol/L — ABNORMAL LOW (ref 22–32)
Calcium: 9.1 mg/dL (ref 8.9–10.3)
Chloride: 100 mmol/L (ref 98–111)
Creatinine, Ser: 1.25 mg/dL — ABNORMAL HIGH (ref 0.61–1.24)
GFR, Estimated: >60 mL/min (ref >60)
Glucose, Bld: 103 mg/dL — ABNORMAL HIGH (ref 70–99)
Potassium: 3 mmol/L — ABNORMAL LOW (ref 3.5–5.1)
Sodium: 134 mmol/L — ABNORMAL LOW (ref 135–145)
Total Bilirubin: 1.7 mg/dL — ABNORMAL HIGH (ref 0.3–1.2)
Total Protein: 8 g/dL (ref 6.5–8.1)

## 2022-10-01 LAB — URINALYSIS, ROUTINE W REFLEX MICROSCOPIC
Bilirubin Urine: NEGATIVE
Glucose, UA: NEGATIVE mg/dL
Hgb urine dipstick: NEGATIVE
Ketones, ur: NEGATIVE mg/dL
Leukocytes,Ua: NEGATIVE
Nitrite: NEGATIVE
Protein, ur: NEGATIVE mg/dL
Specific Gravity, Urine: 1.01 (ref 1.005–1.030)
pH: 7 (ref 5.0–8.0)

## 2022-10-01 LAB — LIPASE, BLOOD: Lipase: 36 U/L (ref 11–51)

## 2022-10-01 MED ORDER — DICYCLOMINE HCL 20 MG PO TABS: 20.0000 mg | ORAL_TABLET | Freq: Two times a day (BID) | ORAL | 0 refills | Status: AC

## 2022-10-01 MED ORDER — MORPHINE SULFATE (PF) 4 MG/ML IV SOLN
4.0000 mg | Freq: Once | INTRAVENOUS | Status: AC
  Administered 2022-10-01: 4 mg via INTRAVENOUS
  Filled 2022-10-01: qty 1

## 2022-10-01 MED ORDER — SODIUM CHLORIDE 0.9 % IV BOLUS
1000.0000 mL | Freq: Once | INTRAVENOUS | Status: AC
  Administered 2022-10-01: 1000 mL via INTRAVENOUS

## 2022-10-01 MED ORDER — IOHEXOL 300 MG/ML  SOLN
100.0000 mL | Freq: Once | INTRAMUSCULAR | Status: AC | PRN
Start: 2022-10-01 — End: 2022-10-01
  Administered 2022-10-01: 100 mL via INTRAVENOUS

## 2022-10-01 MED ORDER — POTASSIUM CHLORIDE CRYS ER 20 MEQ PO TBCR
40.0000 meq | EXTENDED_RELEASE_TABLET | Freq: Once | ORAL | Status: AC
  Administered 2022-10-01: 40 meq via ORAL
  Filled 2022-10-01: qty 2

## 2022-10-01 MED ORDER — PANTOPRAZOLE SODIUM 20 MG PO TBEC
20.0000 mg | DELAYED_RELEASE_TABLET | Freq: Every day | ORAL | 0 refills | Status: AC
Start: 2022-10-01 — End: 2022-10-15

## 2022-10-01 NOTE — Discharge Instructions (Addendum)
You have been evaluated for your symptoms.  No concerning findings were noted on today's exam.  Your CT scan looks normal.  Your potassium level is low.  Please eat a banana a day for the next week and have your potassium level rechecked.  Follow-up with GI specialist for outpatient evaluation of your condition.

## 2022-10-01 NOTE — ED Provider Notes (Signed)
[facility] Provider Note   CSN: [id number] Arrival date & time: 10/01/22  2136     History  Chief Complaint  Patient presents with   Flank Pain   Abdominal Pain    Jason Wang is a 32 y.o. male.  The history is provided by the patient and medical records. No language interpreter was used.  Flank Pain Associated symptoms include abdominal pain.  Abdominal Pain    32 year old male significant history of schizoaffective disorder, asthma, anxiety, presenting complaining of abdominal pain.  Patient report for the past several weeks he has had recurrent pain to the right side of his abdomen.  I described pain as a burning uncomfortable sensation that comes and goes.  Sometimes it is going across his lower abdomen sometimes to the side of his abdomen and sometimes towards the back.  The pain is intense sometimes he endorsed shortness of breath.  Nothing seems to make the pain better or worse.  Pain is not always associated with eating.  No fever or chills no productive cough or shortness of breath no dysuria or hematuria.  States he was seen for this complaint several times and has had CT scan that did not show any acute finding.  He was given medication to help with his pain including acid reducer and antispasmodic which does seem to help but his medication ran out today.  He now endorses worsening pain.  No prior history of PE or DVT.  Home Medications Prior to Admission medications   Medication Sig Start Date End Date Taking? Authorizing Provider  dicyclomine (BENTYL) 20 MG tablet Take 1 tablet (20 mg total) by mouth 2 (two) times daily. 09/19/22   Sherrye Payor A, PA-C  pantoprazole (PROTONIX) 20 MG tablet Take 1 tablet (20 mg total) by mouth daily for 14 days. 09/13/22 09/27/22  Curatolo, Adam, DO  SUMAtriptan Succinate (IMITREX PO) Take by mouth. prn    [provider]      Allergies    Fish allergy and Bee venom    Review of  Systems   Review of Systems  Gastrointestinal:  Positive for abdominal pain.  Genitourinary:  Positive for flank pain.  All other systems reviewed and are negative.   Physical Exam Updated Vital Signs BP (!) 119/99 (BP Location: Right Arm)   Pulse 87   Temp 97.6 F (36.4 C) (Oral)   Resp (!) 24   Wt 68.9 kg   SpO2 100%   BMI 24.53 kg/m  Physical Exam Vitals and nursing note reviewed.  Constitutional:      General: He is not in acute distress.    Appearance: He is well-developed.  HENT:     Head: Atraumatic.  Eyes:     Conjunctiva/sclera: Conjunctivae normal.  Cardiovascular:     Rate and Rhythm: Normal rate and regular rhythm.     Heart sounds: Normal heart sounds.  Pulmonary:     Effort: Pulmonary effort is normal.     Breath sounds: Normal breath sounds.  Abdominal:     Palpations: Abdomen is soft.     Tenderness: There is abdominal tenderness in the right upper quadrant, right lower quadrant and epigastric area. There is no right CVA tenderness, left CVA tenderness, guarding or rebound. Negative signs include Murphy's sign.  Musculoskeletal:     Cervical back: Neck supple.  Skin:    Findings: No rash.  Neurological:     Mental Status: He is alert.  ED Results / Procedures / Treatments   Labs (all labs ordered are listed, but only abnormal results are displayed) Labs Reviewed  CBC WITH DIFFERENTIAL/PLATELET - Abnormal; Notable for the following components:      Result Value   MCV 73.3 (*)    Neutro Abs 1.6 (*)    All other components within normal limits  COMPREHENSIVE METABOLIC PANEL - Abnormal; Notable for the following components:   Sodium 134 (*)    Potassium 3.0 (*)    CO2 21 (*)    Glucose, Bld 103 (*)    Creatinine, Ser 1.25 (*)    Total Bilirubin 1.7 (*)    All other components within normal limits  LIPASE, BLOOD  URINALYSIS, ROUTINE W REFLEX MICROSCOPIC    EKG None  Radiology CT ABDOMEN PELVIS W CONTRAST  Result Date:  10/01/2022 CLINICAL DATA:  Abdominal pain, acute, nonlocalized. EXAM: CT ABDOMEN AND PELVIS WITH CONTRAST TECHNIQUE: Multidetector CT imaging of the abdomen and pelvis was performed using the standard protocol following bolus administration of intravenous contrast. RADIATION DOSE REDUCTION: This exam was performed according to the departmental dose-optimization program which includes automated exposure control, adjustment of the mA and/or kV according to patient size and/or use of iterative reconstruction technique. CONTRAST:  167mL OMNIPAQUE IOHEXOL 300 MG/ML  SOLN COMPARISON:  CT examination dated September 13, 2018 FINDINGS: Lower chest: No acute abnormality. Hepatobiliary: No focal liver abnormality is seen. No gallstones, gallbladder wall thickening, or biliary dilatation. Pancreas: Unremarkable. No pancreatic ductal dilatation or surrounding inflammatory changes. Spleen: Normal in size without focal abnormality. Adrenals/Urinary Tract: Adrenal glands are unremarkable. Kidneys are normal, without renal calculi, focal lesion, or hydronephrosis. Bladder is unremarkable. Stomach/Bowel: Stomach is within normal limits. Appendix appears normal (series 5, image 49). No evidence of bowel wall thickening, distention, or inflammatory changes. Vascular/Lymphatic: No significant vascular findings are present. No enlarged abdominal or pelvic lymph nodes. Reproductive: Prostate is unremarkable. Other: No abdominal wall hernia or abnormality. No abdominopelvic ascites. Musculoskeletal: No acute or significant osseous findings. IMPRESSION: No CT evidence of acute abdominal/pelvic process. Electronically Signed   By: Keane Police D.O.   On: 10/01/2022 23:24    Procedures Procedures    Medications Ordered in ED Medications  morphine (PF) 4 MG/ML injection 4 mg (4 mg Intravenous Given 10/01/22 2230)  potassium chloride SA (KLOR-CON M) CR tablet 40 mEq (40 mEq Oral Given 10/01/22 2328)  sodium chloride 0.9 % bolus 1,000 mL  (1,000 mLs Intravenous New Bag/Given 10/01/22 2333)  iohexol (OMNIPAQUE) 300 MG/ML solution 100 mL (100 mLs Intravenous Contrast Given 10/01/22 2301)    ED Course/ Medical Decision Making/ A&P                             Medical Decision Making Amount and/or Complexity of Data Reviewed Labs: ordered. Radiology: ordered.  Risk Prescription drug management.   BP (!) 119/99 (BP Location: Right Arm)   Pulse 87   Temp 97.6 F (36.4 C) (Oral)   Resp (!) 24   Wt 68.9 kg   SpO2 100%   BMI 24.53 kg/m   73:75 PM 32 year old male significant history of schizoaffective disorder, asthma, anxiety, presenting complaining of abdominal pain.  Patient report for the past several weeks he has had recurrent pain to the right side of his abdomen.  I described pain as a burning uncomfortable sensation that comes and goes.  Sometimes it is going across his lower abdomen sometimes to the  side of his abdomen and sometimes towards the back.  The pain is intense sometimes he endorsed shortness of breath.  Nothing seems to make the pain better or worse.  Pain is not always associated with eating.  No fever or chills no productive cough or shortness of breath no dysuria or hematuria.  States he was seen for this complaint several times and has had CT scan that did not show any acute finding.  He was given medication to help with his pain including acid reducer and antispasmodic which does seem to help but his medication ran out today.  He now endorses worsening pain.  No prior history of PE or DVT.  On exam, patient is laying in bed appears slightly anxious.  He is mildly tachypneic.  Heart with normal rate and rhythm, lungs are clear to auscultation bilaterally abdomen is soft but he does have tenderness to both right upper and right lower abdomen without guarding or rebound tenderness.  No CVA tenderness.  No overlying skin changes.  EMR review patient has been seen twice for his complaint within the past 3  weeks.  Previously had a CT scan of the abdomen pelvis without any acute finding.  Labs overall reassuring.  However given worsening pain prompting this ER visit, will repeat CT scan.  Will provide supportive care.  PE considered however patient is PERC negative, low suspicion for PE.  -Labs ordered, independently viewed and interpreted by me.  Labs remarkable for Cr 1.25.  IVF given.  K+ 3.0, supplementation given.  Normal WBC. UA without blood or UTI.  -The patient was maintained on a cardiac monitor.  I personally viewed and interpreted the cardiac monitored which showed an underlying rhythm of: NSR -Imaging independently viewed and interpreted by me and I agree with radiologist's interpretation.  Result remarkable for abd/pelvis CT without acute changes.   -This patient presents to the ED for concern of abd pain, this involves an extensive number of treatment options, and is a complaint that carries with it a high risk of complications and morbidity.  The differential diagnosis includes gastritis, GERD, cholecystitis, appendicitis, colitis, pancreatitis, UTI, kidney stone, pna -Co morbidities that complicate the patient evaluation includes bipolar, anxiety -Treatment includes morphine, IVF.  -Reevaluation of the patient after these medicines showed that the patient improved -PCP office notes or outside notes reviewed -Escalation to admission/observation considered: patients feels much better, is comfortable with discharge, and will follow up with GI specialist on April 4th -Prescription medication considered, patient comfortable with bentyl and protonix -Social Determinant of Health considered  11:46 PM Potassium is 3.0, supplementation given.  Otherwise labs are reassuring.  CT scan of the abdomen pelvis without any acute finding.  I suspect patient's symptoms likely gastritis/GERD given the burning sensation.  Will refill his medication and gave outpatient follow-up with GI as previously  scheduled.         Final Clinical Impression(s) / ED Diagnoses Final diagnoses:  Generalized abdominal pain    Rx / DC Orders ED Discharge Orders          Ordered    dicyclomine (BENTYL) 20 MG tablet  2 times daily        10/01/22 2349    pantoprazole (PROTONIX) 20 MG tablet  Daily        10/01/22 2349              Domenic Moras, PA-C 10/01/22 2351    Tegeler, Gwenyth Allegra, MD 10/02/22 0002

## 2022-10-01 NOTE — ED Triage Notes (Signed)
Pt arrives with c/o right sided flank pain that radiates into ABD. Pt seen for the same a couple of weeks ago.

## 2024-03-21 ENCOUNTER — Encounter (HOSPITAL_BASED_OUTPATIENT_CLINIC_OR_DEPARTMENT_OTHER): Payer: Self-pay

## 2024-03-21 ENCOUNTER — Other Ambulatory Visit: Payer: Self-pay

## 2024-03-21 ENCOUNTER — Emergency Department (HOSPITAL_BASED_OUTPATIENT_CLINIC_OR_DEPARTMENT_OTHER)
Admission: EM | Admit: 2024-03-21 | Discharge: 2024-03-21 | Disposition: A | Discharge status: Home / Self Care | Payer: Self-pay | Attending: Emergency Medicine | Admitting: Emergency Medicine

## 2024-03-21 ENCOUNTER — Emergency Department (HOSPITAL_BASED_OUTPATIENT_CLINIC_OR_DEPARTMENT_OTHER): Payer: Self-pay

## 2024-03-21 DIAGNOSIS — J4520 Mild intermittent asthma, uncomplicated: Secondary | ICD-10-CM | POA: Insufficient documentation

## 2024-03-21 LAB — CBC WITH DIFFERENTIAL/PLATELET
Abs Immature Granulocytes: 0.01 K/uL (ref 0.00–0.07)
Basophils Absolute: 0 K/uL (ref 0.0–0.1)
Basophils Relative: 1 %
Eosinophils Absolute: 0.1 K/uL (ref 0.0–0.5)
Eosinophils Relative: 2 %
HCT: 43.9 % (ref 39.0–52.0)
Hemoglobin: 15.6 g/dL (ref 13.0–17.0)
Immature Granulocytes: 0 %
Lymphocytes Relative: 56 %
Lymphs Abs: 1.9 K/uL (ref 0.7–4.0)
MCH: 26.8 pg (ref 26.0–34.0)
MCHC: 35.5 g/dL (ref 30.0–36.0)
MCV: 75.3 fL — ABNORMAL LOW (ref 80.0–100.0)
Monocytes Absolute: 0.3 K/uL (ref 0.1–1.0)
Monocytes Relative: 8 %
Neutro Abs: 1.1 K/uL — ABNORMAL LOW (ref 1.7–7.7)
Neutrophils Relative %: 33 %
Platelets: 164 K/uL (ref 150–400)
RBC: 5.83 MIL/uL — ABNORMAL HIGH (ref 4.22–5.81)
RDW: 13.5 % (ref 11.5–15.5)
Smear Review: NORMAL
WBC: 3.4 K/uL — ABNORMAL LOW (ref 4.0–10.5)
nRBC: 0 % (ref 0.0–0.2)

## 2024-03-21 LAB — COMPREHENSIVE METABOLIC PANEL WITH GFR
ALT: 17 U/L (ref 0–44)
AST: 25 U/L (ref 15–41)
Albumin: 4.4 g/dL (ref 3.5–5.0)
Alkaline Phosphatase: 50 U/L (ref 38–126)
Anion gap: 10 (ref 5–15)
BUN: 9 mg/dL (ref 6–20)
CO2: 25 mmol/L (ref 22–32)
Calcium: 9.3 mg/dL (ref 8.9–10.3)
Chloride: 104 mmol/L (ref 98–111)
Creatinine, Ser: 1.26 mg/dL — ABNORMAL HIGH (ref 0.61–1.24)
GFR, Estimated: >60 mL/min (ref >60)
Glucose, Bld: 93 mg/dL (ref 70–99)
Potassium: 4.2 mmol/L (ref 3.5–5.1)
Sodium: 139 mmol/L (ref 135–145)
Total Bilirubin: 1.1 mg/dL (ref 0.0–1.2)
Total Protein: 7.2 g/dL (ref 6.5–8.1)

## 2024-03-21 LAB — RESP PANEL BY RT-PCR (RSV, FLU A&B, COVID)  RVPGX2
Influenza A by PCR: NEGATIVE
Influenza B by PCR: NEGATIVE
Resp Syncytial Virus by PCR: NEGATIVE
SARS Coronavirus 2 by RT PCR: NEGATIVE

## 2024-03-21 LAB — TROPONIN T, HIGH SENSITIVITY: Troponin T High Sensitivity: <15 ng/L (ref 0–19)

## 2024-03-21 MED ORDER — IPRATROPIUM-ALBUTEROL 0.5-2.5 (3) MG/3ML IN SOLN
3.0000 mL | Freq: Once | RESPIRATORY_TRACT | Status: AC
  Administered 2024-03-21: 3 mL via RESPIRATORY_TRACT
  Filled 2024-03-21: qty 3

## 2024-03-21 MED ORDER — PREDNISONE 20 MG PO TABS
40.0000 mg | ORAL_TABLET | Freq: Every day | ORAL | 0 refills | Status: AC
Start: 2024-03-21 — End: ?

## 2024-03-21 NOTE — Progress Notes (Signed)
 Patient ambulated around unit x2. Beginning SPO2 was 98% HR 57 RR 18. Patient reported no SHOB but a little tightness in chest. He had received an albuterol  neb treatment before ambulation. The lowest SPO2 during ambulation was 94% HR 80 RR 21. Patient returned to room and placed back on the monitor.

## 2024-03-21 NOTE — Discharge Instructions (Addendum)
 I'm glad you are feeling better.  Please follow-up with your primary care provider.  Seek emergency care if experiencing any new or worsening symptoms.

## 2024-03-21 NOTE — ED Triage Notes (Signed)
 Last week had sinus pressure, headache and fatigue. Felt better over the weekend. Yesterday went back to work and started feeling unwell again. Started becoming short of breath and some chest pressure yesterday. Hx of asthma. Used nebulizer without much improvement.

## 2024-03-21 NOTE — ED Provider Notes (Signed)
 [place] [facility] Provider Note   CSN: [id number] Arrival date & time: 03/21/24  9077     Patient presents with: Shortness of Breath   Jason Wang is a 32 y.o. male with PMHx anxiety, asthma, bipolar 1 disorder, schizoaffective disorder who presents to ED concerned for chest tightness. Patient with sinus pressure, headache, and fatigue last week which has since improved. Patient then started noticing SOB and chest tightness yesterday. The SOB resolved with their home nebulizing treatment yesterday. Patient becoming SOB again today and only had 1/2 of a nebulizing treatment available.  Patient still with slight middle chest tightness after nebulizing treatment at home so he came to the ED.  Denies recent surgery/immobilization, hx DVT/PE, hemoptysis, hx cancer in the past 6 months, calf swelling/tenderness. Denies tobacco use. Denies fever, nausea, vomiting, diarrhea.     Shortness of Breath      Prior to Admission medications   Medication Sig Start Date End Date Taking? Authorizing Provider  predniSONE  (DELTASONE ) 20 MG tablet Take 2 tablets (40 mg total) by mouth daily. 03/21/24  Yes Hoy Fraction F, PA-C  dicyclomine  (BENTYL ) 20 MG tablet Take 1 tablet (20 mg total) by mouth 2 (two) times daily. 10/01/22   Nivia Colon, PA-C  pantoprazole  (PROTONIX ) 20 MG tablet Take 1 tablet (20 mg total) by mouth daily for 14 days. 10/01/22 10/15/22  Nivia Colon, PA-C  SUMAtriptan Succinate (IMITREX PO) Take by mouth. prn    [provider]    Allergies: Fish allergy and Bee venom    Review of Systems  Respiratory:  Positive for shortness of breath.     Updated Vital Signs BP (!) 132/97 (BP Location: Right Arm)   Pulse (!) 55   Temp 97.7 F (36.5 C) (Oral)   Resp 18   Ht 5' 6 (1.676 m)   Wt 68.5 kg   SpO2 100%   BMI 24.37 kg/m   Physical Exam Vitals and nursing note reviewed.  Constitutional:      General: He is not in acute  distress.    Appearance: He is not ill-appearing or toxic-appearing.  HENT:     Head: Normocephalic and atraumatic.     Mouth/Throat:     Mouth: Mucous membranes are moist.     Pharynx: No posterior oropharyngeal erythema.  Eyes:     General: No scleral icterus.       Right eye: No discharge.        Left eye: No discharge.     Conjunctiva/sclera: Conjunctivae normal.  Cardiovascular:     Rate and Rhythm: Normal rate and regular rhythm.     Pulses: Normal pulses.     Heart sounds: No murmur heard. Pulmonary:     Effort: Pulmonary effort is normal. No respiratory distress.     Breath sounds: Normal breath sounds. No wheezing, rhonchi or rales.  Abdominal:     Tenderness: There is no abdominal tenderness.  Musculoskeletal:     Right lower leg: No edema.     Left lower leg: No edema.     Comments: No erythema, swelling or tenderness of BL calves/LE.  Skin:    General: Skin is warm and dry.     Findings: No rash.  Neurological:     General: No focal deficit present.     Mental Status: He is alert and oriented to person, place, and time. Mental status is at baseline.  Psychiatric:        Mood  and Affect: Mood normal.        Behavior: Behavior normal.     (all labs ordered are listed, but only abnormal results are displayed) Labs Reviewed  CBC WITH DIFFERENTIAL/PLATELET - Abnormal; Notable for the following components:      Result Value   WBC 3.4 (*)    RBC 5.83 (*)    MCV 75.3 (*)    Neutro Abs 1.1 (*)    All other components within normal limits  COMPREHENSIVE METABOLIC PANEL WITH GFR - Abnormal; Notable for the following components:   Creatinine, Ser 1.26 (*)    All other components within normal limits  RESP PANEL BY RT-PCR (RSV, FLU A&B, COVID)  [id number]  TROPONIN T, HIGH SENSITIVITY    EKG: EKG Interpretation Date/Time:  Tuesday March 21 2024 09:32:49 EDT Ventricular Rate:  68 PR Interval:  157 QRS Duration:  97 QT Interval:  387 QTC Calculation: 412 R  Axis:   89  Text Interpretation: Sinus arrhythmia Consider left atrial enlargement ST elev, probable normal early repol pattern agree, no sig chnage from previous Confirmed by [name] [phone]) on 03/21/2024 10:36:33 AM  Radiology: [facility] Chest 2 View Result Date: 03/21/2024 EXAM: 2 VIEW(S) XRAY OF THE CHEST 03/21/2024 09:49:43 AM COMPARISON: 11/18/2014. CLINICAL HISTORY: Chest pressure, short of breath, recently sick. Last week had sinus pressure, headache and fatigue. Felt better over the weekend. Yesterday went back to work and started feeling unwell again. Started becoming short of breath and some chest pressure yesterday. History of asthma. Used nebulizer without much improvement. FINDINGS: LUNGS AND PLEURA: No focal pulmonary opacity. No pulmonary edema. No pleural effusion. No pneumothorax. HEART AND MEDIASTINUM: No acute abnormality of the cardiac and mediastinal silhouettes. BONES AND SOFT TISSUES: No acute osseous abnormality. IMPRESSION: 1. No acute process. Electronically signed by: [name] MD 03/21/2024 10:58 AM EDT RP Workstation: [id number]     Procedures   Medications Ordered in the ED  ipratropium-albuterol  (DUONEB) 0.5-2.5 (3) MG/3ML nebulizer solution 3 mL (3 mLs Nebulization Given 03/21/24 1021)                                    Medical Decision Making Amount and/or Complexity of Data Reviewed Labs: ordered. Radiology: ordered.  Risk Prescription drug management.   This patient presents to the ED for concern of shortness of breath, this involves an extensive number of treatment options, and is a complaint that carries with it a high risk of complications and morbidity.  The differential diagnosis includes Anxiety, Anaphylaxis/Angioedema, Aspirated FB, Arrhythmia, CHF, Asthma, COPD, PNA, COVID/Flu/RSV, STEMI, Tamponade, TPNX, Sepsis   Co morbidities that complicate the patient evaluation  anxiety, asthma, bipolar 1 disorder, schizoaffective  disorder   Additional history obtained:  Dr. Neysa PCP   Problem List / ED Course / Critical interventions / Medication management  Patient presents to ED concerned for SOB/chest tightness I did not fully go away after half of a nebulizing treatment at home earlier today.  Physical exam reassuring.  Patient afebrile with stable vitals.  PERC/Wells score 0. I Ordered, and personally interpreted labs.  CBC without leukocytosis or anemia.  CMP with mild elevation in creatinine at 1.26 which is near patient's baseline.  Respiratory panel negative.  Troponin within normal limits. The patient was maintained on a cardiac monitor.  I personally viewed and interpreted the cardiac monitored which showed an underlying rhythm of: Sinus arrhythmia I ordered imaging  studies including chest xray to assess for process contributing to patient's symptoms. I independently visualized and interpreted imaging which showed no acute process. I agree with the radiologist interpretation Provided patient with DuoNeb treatment.  Patient's symptoms have since resolved.  Patient now feeling good.  Shared with patient that we could consider furthering testing with D-dimer or possible CT scan of chest.  Patient declining this and stating that he feels better.  Patient stating that his asthma always acts up more during the change of seasons.  Patient would prefer to be discharged now with short steroid burst. I have reviewed the patients home medicines and have made adjustments as needed The patient has been appropriately medically screened and/or stabilized in the ED. I have low suspicion for any other emergent medical condition which would require further screening, evaluation or treatment in the ED or require inpatient management. At time of discharge the patient is hemodynamically stable and in no acute distress. I have discussed work-up results and diagnosis with patient and answered all questions. Patient is agreeable with  discharge plan. We discussed strict return precautions for returning to the emergency department and they verbalized understanding.    Risk Stratification Score:  PERC/Wells: 0  Social Determinants of Health:  none       Final diagnoses:  Mild intermittent asthma without complication    ED Discharge Orders          Ordered    predniSONE  (DELTASONE ) 20 MG tablet  Daily        03/21/24 1152               Hoy Nidia FALCON, [place] 03/21/24 1200    [name], MD 03/21/24 [phone]

## 2024-06-22 ENCOUNTER — Encounter (HOSPITAL_BASED_OUTPATIENT_CLINIC_OR_DEPARTMENT_OTHER): Payer: Self-pay | Admitting: Emergency Medicine

## 2024-06-22 ENCOUNTER — Emergency Department (HOSPITAL_BASED_OUTPATIENT_CLINIC_OR_DEPARTMENT_OTHER)
Admission: EM | Admit: 2024-06-22 | Discharge: 2024-06-22 | Disposition: A | Discharge status: Home / Self Care | Attending: Emergency Medicine | Admitting: Emergency Medicine

## 2024-06-22 ENCOUNTER — Other Ambulatory Visit: Payer: Self-pay

## 2024-06-22 ENCOUNTER — Emergency Department (HOSPITAL_BASED_OUTPATIENT_CLINIC_OR_DEPARTMENT_OTHER)

## 2024-06-22 DIAGNOSIS — J45901 Unspecified asthma with (acute) exacerbation: Secondary | ICD-10-CM | POA: Diagnosis not present

## 2024-06-22 DIAGNOSIS — Z7952 Long term (current) use of systemic steroids: Secondary | ICD-10-CM | POA: Insufficient documentation

## 2024-06-22 DIAGNOSIS — R0602 Shortness of breath: Secondary | ICD-10-CM | POA: Diagnosis present

## 2024-06-22 LAB — CBC WITH DIFFERENTIAL/PLATELET
Abs Immature Granulocytes: 0 K/uL (ref 0.00–0.07)
Basophils Absolute: 0 K/uL (ref 0.0–0.1)
Basophils Relative: 1 %
Eosinophils Absolute: 0.3 K/uL (ref 0.0–0.5)
Eosinophils Relative: 7 %
HCT: 45.1 % (ref 39.0–52.0)
Hemoglobin: 16.1 g/dL (ref 13.0–17.0)
Immature Granulocytes: 0 %
Lymphocytes Relative: 55 %
Lymphs Abs: 2.5 K/uL (ref 0.7–4.0)
MCH: 26.4 pg (ref 26.0–34.0)
MCHC: 35.7 g/dL (ref 30.0–36.0)
MCV: 73.8 fL — ABNORMAL LOW (ref 80.0–100.0)
Monocytes Absolute: 0.4 K/uL (ref 0.1–1.0)
Monocytes Relative: 9 %
Neutro Abs: 1.2 K/uL — ABNORMAL LOW (ref 1.7–7.7)
Neutrophils Relative %: 28 %
Platelets: 196 K/uL (ref 150–400)
RBC: 6.11 MIL/uL — ABNORMAL HIGH (ref 4.22–5.81)
RDW: 13.4 % (ref 11.5–15.5)
WBC: 4.4 K/uL (ref 4.0–10.5)
nRBC: 0 % (ref 0.0–0.2)

## 2024-06-22 LAB — BASIC METABOLIC PANEL WITH GFR
Anion gap: 14 (ref 5–15)
BUN: 14 mg/dL (ref 6–20)
CO2: 20 mmol/L — ABNORMAL LOW (ref 22–32)
Calcium: 9.4 mg/dL (ref 8.9–10.3)
Chloride: 103 mmol/L (ref 98–111)
Creatinine, Ser: 1.13 mg/dL (ref 0.61–1.24)
GFR, Estimated: >60 mL/min (ref >60)
Glucose, Bld: 89 mg/dL (ref 70–99)
Potassium: 3.9 mmol/L (ref 3.5–5.1)
Sodium: 137 mmol/L (ref 135–145)

## 2024-06-22 LAB — D-DIMER, QUANTITATIVE: D-Dimer, Quant: 0.42 ug{FEU}/mL (ref 0.00–0.50)

## 2024-06-22 LAB — TROPONIN T, HIGH SENSITIVITY: Troponin T High Sensitivity: <15 ng/L (ref 0–19)

## 2024-06-22 MED ORDER — DEXAMETHASONE SOD PHOSPHATE PF 10 MG/ML IJ SOLN
10.0000 mg | Freq: Once | INTRAMUSCULAR | Status: AC
  Administered 2024-06-22: 10 mg via INTRAVENOUS

## 2024-06-22 MED ORDER — PREDNISONE 20 MG PO TABS: 40.0000 mg | ORAL_TABLET | Freq: Every day | ORAL | 0 refills | Status: AC

## 2024-06-22 MED ORDER — IPRATROPIUM-ALBUTEROL 0.5-2.5 (3) MG/3ML IN SOLN
3.0000 mL | Freq: Once | RESPIRATORY_TRACT | Status: AC
  Administered 2024-06-22: 3 mL via RESPIRATORY_TRACT
  Filled 2024-06-22: qty 3

## 2024-06-22 NOTE — ED Notes (Signed)
 RT assessed upon arrival to room. Patient stated he has been using his MDI 3 times a day recently, and last albuterol  neb taken at 0700 today. BBS clear, slightly diminished in bases. SAT 100%. RN at bedside doing triage, will reassess shortly.

## 2024-06-22 NOTE — Discharge Instructions (Addendum)
 You were seen here today for your shortness of breath.  This is likely an asthma exacerbation.  You been prescribed prednisone  which is an anti-inflammatory to help open up the lungs.  Please take this daily for the next 5 days as prescribed.  Continue to take your albuterol  inhaler at home as needed.   If you are finding you need your albuterol  inhaler more than 2-3 times a week, you need to follow-up with your PCP to see if you need a maintenance medication.  Your labs today were reassuring.  You have normal blood counts and electrolytes.  No signs of any blood clots.  Your heart testing was normal, no signs of a heart attack.  Your EKG which shows your heart rhythm was normal.  Your chest x-ray was normal.  Please return to the ER for any worsening shortness of breath, chest pain, any other new or concerning symptoms

## 2024-06-22 NOTE — ED Provider Notes (Signed)
 [place] [facility] Provider Note   CSN: [id number] Arrival date & time: 06/22/24  1029     Patient presents with: Shortness of Breath   Jason Wang is a 33 y.o. male with history of asthma, presents with concern for shortness of breath that began last night.  Reports he felt like this was consistent with an asthma flare, and took his albuterol  inhaler with some relief in his symptoms.  He reports that he was at work this morning and felt the shortness of breath return as he was moving around at work.  He denies any chest pain.  Denies any cough, fever, or chills.  Denies any pleuritic pains.  Denies any recent long plane or car rides, recent hospitalizations or surgeries, any history of blood clot, or in any swelling or pain in his legs.    Shortness of Breath      Prior to Admission medications  Medication Sig Start Date End Date Taking? Authorizing Provider  dicyclomine  (BENTYL ) 20 MG tablet Take 1 tablet (20 mg total) by mouth 2 (two) times daily. 10/01/22   Nivia Colon, PA-C  pantoprazole  (PROTONIX ) 20 MG tablet Take 1 tablet (20 mg total) by mouth daily for 14 days. 10/01/22 10/15/22  Nivia Colon, PA-C  predniSONE  (DELTASONE ) 20 MG tablet Take 2 tablets (40 mg total) by mouth daily with breakfast for 5 days. 06/22/24 06/27/24  Veta Palma, PA-C  SUMAtriptan Succinate (IMITREX PO) Take by mouth. prn    [provider]    Allergies: Fish allergy and Bee venom    Review of Systems  Respiratory:  Positive for shortness of breath.     Updated Vital Signs Pulse 65   Temp (!) 96.3 F (35.7 C) (Tympanic)   Resp 13   Wt 68.5 kg   SpO2 99%   BMI 24.37 kg/m   Physical Exam Vitals and nursing note reviewed.  Constitutional:      General: He is not in acute distress.    Appearance: He is well-developed.  HENT:     Head: Normocephalic and atraumatic.     Mouth/Throat:     Pharynx: No oropharyngeal exudate or posterior  oropharyngeal erythema.  Eyes:     Conjunctiva/sclera: Conjunctivae normal.  Cardiovascular:     Rate and Rhythm: Normal rate and regular rhythm.     Heart sounds: No murmur heard. Pulmonary:     Effort: Pulmonary effort is normal. No respiratory distress.     Breath sounds: Normal breath sounds.     Comments: Talking in full sentences on room air without difficulty. Lungs clear to auscultation bilaterally, no wheezing appreciated Abdominal:     Palpations: Abdomen is soft.     Tenderness: There is no abdominal tenderness.  Musculoskeletal:        General: No swelling.     Cervical back: Neck supple.     Right lower leg: No edema.     Left lower leg: No edema.     Comments: No calf edema or tenderness to palpation bilaterally  Lymphadenopathy:     Cervical: No cervical adenopathy.  Skin:    General: Skin is warm and dry.     Capillary Refill: Capillary refill takes less than 2 seconds.  Neurological:     Mental Status: He is alert.  Psychiatric:        Mood and Affect: Mood normal.     (all labs ordered are listed, but only abnormal results are displayed) Labs  Reviewed  CBC WITH DIFFERENTIAL/PLATELET - Abnormal; Notable for the following components:      Result Value   RBC 6.11 (*)    MCV 73.8 (*)    Neutro Abs 1.2 (*)    All other components within normal limits  BASIC METABOLIC PANEL WITH GFR - Abnormal; Notable for the following components:   CO2 20 (*)    All other components within normal limits  D-DIMER, QUANTITATIVE (NOT AT [facility])  TROPONIN T, HIGH SENSITIVITY    EKG: None  Radiology: DG Chest 2 View Result Date: 06/22/2024 EXAM: XR Chest, 2 View(s). CLINICAL HISTORY: shortness of breath COMPARISON: 03/21/2024 FINDINGS: LUNGS: The lungs are clear. No consolidation. PLEURAL SPACES: No pleural effusion or pneumothorax. HEART: The heart size is normal. BONES: No acute osseous abnormality. IMPRESSION: 1. No acute cardiopulmonary abnormality. Electronically  signed by: Lynwood Seip MD 06/22/2024 12:23 PM EST RP Workstation: [id number]     Procedures   Medications Ordered in the ED  ipratropium-albuterol  (DUONEB) 0.5-2.5 (3) MG/3ML nebulizer solution 3 mL (3 mLs Nebulization Given 06/22/24 1113)  dexamethasone (DECADRON) injection 10 mg (10 mg Intravenous Given 06/22/24 1117)                                    Medical Decision Making Amount and/or Complexity of Data Reviewed Labs: ordered. Radiology: ordered.  Risk Prescription drug management.     Differential diagnosis includes but is not limited to arrhythmia, asthma exacerbation, PE, pneumonia, pleural effusion, anemia   ED Course:  Upon initial evaluation, patient is very well-appearing, no acute distress.  Normal vital signs.  Reporting shortness of breath, but is talking in full sentences on room air without difficulty.  Maintaining at 100% oxygen saturation on room air.  Lungs clear to auscultation bilaterally.  I do not appreciate any wheezing currently.   Labs Ordered: I Ordered, and personally interpreted labs.  The pertinent results include:   CBC without leukocytosis.  No anemia. BMP within normal limits D-dimer within normal limits Troponin within normal limits  Imaging Studies ordered: I ordered imaging studies including chest x-ray I independently visualized the imaging with scope of interpretation limited to determining acute life threatening conditions related to emergency care. Imaging showed no acute abnormalities I agree with the radiologist interpretation   Cardiac Monitoring: / EKG: The patient was maintained on a cardiac monitor.  I personally viewed and interpreted the cardiac monitored which showed an underlying rhythm of: Normal sinus rhythm   Medications Given: Decadron DuoNeb  Upon re-evaluation, patient remains well-appearing with stable vitals.  He reports he feels improved after the medications given.   Low concern for ACS at this time  given troponin within normal limits, no chest pain, and EKG with normal sinus rhythm and no ST changes. HEART score of 0. Chest x-ray without any acute abnormality. No concern for DVT or PE at this time given D-dimer within normal limits.  CBC without any evidence of anemia.  Suspect his symptoms of shortness of breath were due to an asthma exacerbation given the improvement with the Decadron and DuoNeb given here, and otherwise unremarkable workup.  Will treat for an asthma exacerbation at home with short course of prednisone .  Patient stable and appropriate for discharge home at this time    Impression: Mild asthma exacerbation  Disposition:  The patient was discharged home with instructions to take course of prednisone  as prescribed.  Follow-up with  PCP for further management of his asthma. Return precautions given and patient verbalized understanding.    Record Review: External records from outside source obtained and reviewed including previous ER visits for asthma exacerbations     This chart was dictated using voice recognition software, Dragon. Despite the best efforts of this provider to proofread and correct errors, errors may still occur which can change documentation meaning.       Final diagnoses:  Mild asthma with exacerbation, unspecified whether persistent    ED Discharge Orders          Ordered    predniSONE  (DELTASONE ) 20 MG tablet  Daily with breakfast        06/22/24 1252               Veta Palma, PA-C 06/22/24 1253    Dreama Longs, MD 06/22/24 2232

## 2024-06-22 NOTE — ED Triage Notes (Signed)
 Shortness of breath x last night , Hx asthma , used inhaler and home breathing tretament with no relief . Unable to speak full sentence, yet 100 % RA .  Alert and oriented x 4 .
